# Patient Record
Sex: Female | Born: 1970
Health system: Southern US, Community
[De-identification: ages and names within clinical notes are randomized; demographics above are authoritative.]

## PROBLEM LIST (undated history)

## (undated) DIAGNOSIS — I10 Essential (primary) hypertension: Secondary | ICD-10-CM

## (undated) DIAGNOSIS — E119 Type 2 diabetes mellitus without complications: Secondary | ICD-10-CM

## (undated) DIAGNOSIS — E785 Hyperlipidemia, unspecified: Secondary | ICD-10-CM

## (undated) DIAGNOSIS — K439 Ventral hernia without obstruction or gangrene: Secondary | ICD-10-CM

## (undated) HISTORY — PX: BREAST SURGERY: SHX581

## (undated) HISTORY — PX: HERNIA REPAIR: SHX51

## (undated) HISTORY — PX: ABDOMINAL HYSTERECTOMY: SHX81

---

## 2006-06-20 LAB — CONVERTED CEMR LAB: Pap Smear: NORMAL

## 2007-01-11 ENCOUNTER — Ambulatory Visit: Payer: Self-pay | Admitting: Internal Medicine

## 2007-01-11 DIAGNOSIS — K219 Gastro-esophageal reflux disease without esophagitis: Secondary | ICD-10-CM

## 2007-01-11 DIAGNOSIS — E119 Type 2 diabetes mellitus without complications: Secondary | ICD-10-CM | POA: Insufficient documentation

## 2007-01-11 DIAGNOSIS — I10 Essential (primary) hypertension: Secondary | ICD-10-CM | POA: Insufficient documentation

## 2007-01-11 DIAGNOSIS — Z8719 Personal history of other diseases of the digestive system: Secondary | ICD-10-CM | POA: Insufficient documentation

## 2007-01-11 DIAGNOSIS — E785 Hyperlipidemia, unspecified: Secondary | ICD-10-CM | POA: Insufficient documentation

## 2007-01-18 LAB — CONVERTED CEMR LAB
BUN: 13 mg/dL (ref 6–23)
Calcium: 9.9 mg/dL (ref 8.4–10.5)
Creatinine,U: 127.7 mg/dL
GFR calc Af Amer: 144 mL/min
GFR calc non Af Amer: 119 mL/min
Glucose, Bld: 128 mg/dL — ABNORMAL HIGH (ref 70–99)
Microalb Creat Ratio: 14.9 mg/g (ref 0.0–30.0)

## 2007-04-11 ENCOUNTER — Encounter: Payer: Self-pay | Admitting: Internal Medicine

## 2007-05-25 ENCOUNTER — Ambulatory Visit: Payer: Self-pay | Admitting: Internal Medicine

## 2007-05-25 DIAGNOSIS — M25579 Pain in unspecified ankle and joints of unspecified foot: Secondary | ICD-10-CM

## 2007-05-25 LAB — CONVERTED CEMR LAB
Bilirubin Urine: NEGATIVE
Blood in Urine, dipstick: NEGATIVE
Glucose, Urine, Semiquant: NEGATIVE
Microalb Creat Ratio: 11 mg/g (ref 0.0–30.0)
Microalb, Ur: 1 mg/dL (ref 0.0–1.9)
Protein, U semiquant: NEGATIVE
Urobilinogen, UA: 0.2
WBC Urine, dipstick: NEGATIVE

## 2007-05-26 ENCOUNTER — Ambulatory Visit: Payer: Self-pay | Admitting: Internal Medicine

## 2007-06-01 LAB — CONVERTED CEMR LAB
Basophils Absolute: 0 10*3/uL (ref 0.0–0.1)
Direct LDL: 141.6 mg/dL
Eosinophils Absolute: 0.2 10*3/uL (ref 0.0–0.7)
HDL: 48.2 mg/dL (ref >39.0)
Hgb A1c MFr Bld: 6.7 % — ABNORMAL HIGH (ref 4.6–6.0)
MCHC: 33.3 g/dL (ref 30.0–36.0)
MCV: 87.6 fL (ref 78.0–100.0)
Neutrophils Relative %: 54.4 % (ref 43.0–77.0)
Platelets: 322 10*3/uL (ref 150–400)
Total CK: 165 units/L (ref 7–177)
Triglycerides: 151 mg/dL — ABNORMAL HIGH (ref 0–149)

## 2007-06-09 ENCOUNTER — Telehealth (INDEPENDENT_AMBULATORY_CARE_PROVIDER_SITE_OTHER): Payer: Self-pay | Admitting: *Deleted

## 2007-06-14 ENCOUNTER — Telehealth (INDEPENDENT_AMBULATORY_CARE_PROVIDER_SITE_OTHER): Payer: Self-pay | Admitting: *Deleted

## 2007-06-15 ENCOUNTER — Encounter: Payer: Self-pay | Admitting: Internal Medicine

## 2007-06-27 ENCOUNTER — Telehealth (INDEPENDENT_AMBULATORY_CARE_PROVIDER_SITE_OTHER): Payer: Self-pay | Admitting: *Deleted

## 2007-06-30 ENCOUNTER — Encounter: Payer: Self-pay | Admitting: Internal Medicine

## 2007-07-25 ENCOUNTER — Telehealth (INDEPENDENT_AMBULATORY_CARE_PROVIDER_SITE_OTHER): Payer: Self-pay | Admitting: *Deleted

## 2007-08-11 ENCOUNTER — Telehealth (INDEPENDENT_AMBULATORY_CARE_PROVIDER_SITE_OTHER): Payer: Self-pay | Admitting: *Deleted

## 2007-08-17 ENCOUNTER — Telehealth (INDEPENDENT_AMBULATORY_CARE_PROVIDER_SITE_OTHER): Payer: Self-pay | Admitting: *Deleted

## 2013-07-29 ENCOUNTER — Other Ambulatory Visit: Payer: Self-pay | Admitting: Endocrinology

## 2013-09-07 ENCOUNTER — Other Ambulatory Visit: Payer: Self-pay | Admitting: Endocrinology

## 2016-08-27 ENCOUNTER — Encounter (HOSPITAL_BASED_OUTPATIENT_CLINIC_OR_DEPARTMENT_OTHER): Payer: Self-pay | Admitting: Emergency Medicine

## 2016-08-27 ENCOUNTER — Emergency Department (HOSPITAL_BASED_OUTPATIENT_CLINIC_OR_DEPARTMENT_OTHER)
Admission: EM | Admit: 2016-08-27 | Discharge: 2016-08-27 | Disposition: A | Discharge status: Home / Self Care | Payer: BC Managed Care – PPO | Attending: Emergency Medicine | Admitting: Emergency Medicine

## 2016-08-27 DIAGNOSIS — I10 Essential (primary) hypertension: Secondary | ICD-10-CM | POA: Insufficient documentation

## 2016-08-27 DIAGNOSIS — Z79899 Other long term (current) drug therapy: Secondary | ICD-10-CM | POA: Insufficient documentation

## 2016-08-27 DIAGNOSIS — G8929 Other chronic pain: Secondary | ICD-10-CM

## 2016-08-27 DIAGNOSIS — Z7984 Long term (current) use of oral hypoglycemic drugs: Secondary | ICD-10-CM | POA: Insufficient documentation

## 2016-08-27 DIAGNOSIS — E119 Type 2 diabetes mellitus without complications: Secondary | ICD-10-CM | POA: Diagnosis not present

## 2016-08-27 DIAGNOSIS — R109 Unspecified abdominal pain: Secondary | ICD-10-CM | POA: Insufficient documentation

## 2016-08-27 DIAGNOSIS — R1013 Epigastric pain: Secondary | ICD-10-CM | POA: Diagnosis present

## 2016-08-27 DIAGNOSIS — Z794 Long term (current) use of insulin: Secondary | ICD-10-CM | POA: Insufficient documentation

## 2016-08-27 HISTORY — DX: Type 2 diabetes mellitus without complications: E11.9

## 2016-08-27 HISTORY — DX: Hyperlipidemia, unspecified: E78.5

## 2016-08-27 HISTORY — DX: Ventral hernia without obstruction or gangrene: K43.9

## 2016-08-27 HISTORY — DX: Essential (primary) hypertension: I10

## 2016-08-27 LAB — URINALYSIS, ROUTINE W REFLEX MICROSCOPIC
BILIRUBIN URINE: NEGATIVE
HGB URINE DIPSTICK: NEGATIVE
Ketones, ur: NEGATIVE mg/dL
Leukocytes, UA: NEGATIVE
Nitrite: NEGATIVE
PROTEIN: NEGATIVE mg/dL
SPECIFIC GRAVITY, URINE: 1.038 — AB (ref 1.005–1.030)
pH: 6.5 (ref 5.0–8.0)

## 2016-08-27 LAB — URINALYSIS, MICROSCOPIC (REFLEX)

## 2016-08-27 LAB — CBC WITH DIFFERENTIAL/PLATELET
BASOS ABS: 0 10*3/uL (ref 0.0–0.1)
Basophils Relative: 1 %
Eosinophils Absolute: 0.1 10*3/uL (ref 0.0–0.7)
Eosinophils Relative: 2 %
HEMATOCRIT: 39.4 % (ref 36.0–46.0)
HEMOGLOBIN: 13.1 g/dL (ref 12.0–15.0)
LYMPHS ABS: 2.4 10*3/uL (ref 0.7–4.0)
LYMPHS PCT: 38 %
MCH: 28.5 pg (ref 26.0–34.0)
MCHC: 33.2 g/dL (ref 30.0–36.0)
MCV: 85.7 fL (ref 78.0–100.0)
Monocytes Absolute: 0.6 10*3/uL (ref 0.1–1.0)
Monocytes Relative: 10 %
NEUTROS ABS: 3.1 10*3/uL (ref 1.7–7.7)
NEUTROS PCT: 49 %
PLATELETS: 334 10*3/uL (ref 150–400)
RBC: 4.6 MIL/uL (ref 3.87–5.11)
RDW: 15.1 % (ref 11.5–15.5)
WBC: 6.2 10*3/uL (ref 4.0–10.5)

## 2016-08-27 LAB — COMPREHENSIVE METABOLIC PANEL
ALK PHOS: 57 U/L (ref 38–126)
ALT: 24 U/L (ref 14–54)
AST: 21 U/L (ref 15–41)
Albumin: 4 g/dL (ref 3.5–5.0)
Anion gap: 9 (ref 5–15)
BILIRUBIN TOTAL: 0.4 mg/dL (ref 0.3–1.2)
BUN: 13 mg/dL (ref 6–20)
CALCIUM: 9.5 mg/dL (ref 8.9–10.3)
CHLORIDE: 105 mmol/L (ref 101–111)
CO2: 26 mmol/L (ref 22–32)
CREATININE: 0.69 mg/dL (ref 0.44–1.00)
GFR calc Af Amer: >60 mL/min (ref >60)
Glucose, Bld: 203 mg/dL — ABNORMAL HIGH (ref 65–99)
Potassium: 4.4 mmol/L (ref 3.5–5.1)
Sodium: 140 mmol/L (ref 135–145)
Total Protein: 7.6 g/dL (ref 6.5–8.1)

## 2016-08-27 LAB — LIPASE, BLOOD: LIPASE: 26 U/L (ref 11–51)

## 2016-08-27 MED ORDER — ONDANSETRON HCL 4 MG/2ML IJ SOLN
4.0000 mg | Freq: Once | INTRAMUSCULAR | Status: AC
  Administered 2016-08-27: 4 mg via INTRAVENOUS
  Filled 2016-08-27: qty 2

## 2016-08-27 MED ORDER — ONDANSETRON HCL 4 MG PO TABS: 4.0000 mg | ORAL_TABLET | Freq: Three times a day (TID) | ORAL | 0 refills | Status: AC | PRN

## 2016-08-27 MED ORDER — SODIUM CHLORIDE 0.9 % IV BOLUS (SEPSIS)
1000.0000 mL | Freq: Once | INTRAVENOUS | Status: AC
  Administered 2016-08-27: 1000 mL via INTRAVENOUS

## 2016-08-27 MED ORDER — SODIUM CHLORIDE 0.9 % IV SOLN: 1000.0000 mL | INTRAVENOUS | Status: DC

## 2016-08-27 MED ORDER — GI COCKTAIL ~~LOC~~
30.0000 mL | Freq: Once | ORAL | Status: AC
  Administered 2016-08-27: 30 mL via ORAL
  Filled 2016-08-27: qty 30

## 2016-08-27 MED ORDER — FAMOTIDINE 40 MG PO TABS: 40.0000 mg | ORAL_TABLET | Freq: Every day | ORAL | 0 refills | Status: AC

## 2016-08-27 MED FILL — ONDANSETRON HCL 4 MG TABLET: 4 | 6 days supply | Qty: 18 | Fill #0

## 2016-08-27 MED FILL — FAMOTIDINE 40 MG TABLET: 40 | 30 days supply | Qty: 30 | Fill #0

## 2016-08-27 NOTE — Discharge Instructions (Signed)
Continue to take your at-home medications. You may use Zofran as needed for nausea or vomiting. You may start Pepcid once daily. Follow-up with your gastroenterologist for further evaluation of your abdominal pain. Return to the emergency department if you develop fever, chills, vomiting, worsening pain, or any new or worsening symptoms.

## 2016-08-27 NOTE — ED Triage Notes (Signed)
Patient reports abdominal pain which began today. Reports continuous problems with abdomen after surgery in 2013 for inguinal hernia.  Patient reports she was seen yesterday and had a scan done of her aorta as well as ultrasound of kidneys due to constant pain that she has been having.  Patient sees GI doctor at present with [facility]. Reports N/V/D today.

## 2016-08-27 NOTE — ED Provider Notes (Signed)
[facility] Provider [facility]   CSN: [id number] Arrival date & time: 08/27/16  0930     History   Chief Complaint Chief Complaint  Patient presents with  . Abdominal Pain    HPI Connie Nelson is a 46 y.o. female presenting with abdominal pain.  Patient has a several year history of abdominal pain, is presenting today because her pain is persistent. She describes it as epigastric pressure that pushes up and causes nausea and burning. The pain is intermittent, and not associated with oral intake. His pain has been present since 2013 when she had an hernia repair surgery. She is followed by gastroenterology, and had workup for gallbladder disease yesterday. Nothing makes her pain better. She is an appointment follow-up with gastroenterology on the 20th of this month. She denies fever, chills, cough, chest pain, shortness of breath, vomiting, urinary symptoms, or abnormal bowel movements.  HPI  Past Medical History:  Diagnosis Date  . Diabetes mellitus without complication (HCC)   . Hernia of abdominal wall   . Hyperlipidemia   . Hypertension     Patient Active Problem List   Diagnosis Date Noted  . ANKLE PAIN, LEFT 05/25/2007  . DIABETES MELLITUS, TYPE II 01/11/2007  . HYPERLIPIDEMIA 01/11/2007  . HYPERTENSION 01/11/2007  . GERD 01/11/2007  . GASTRIC POLYP, HX OF 01/11/2007    Past Surgical History:  Procedure Laterality Date  . ABDOMINAL HYSTERECTOMY    . BREAST SURGERY    . HERNIA REPAIR      OB History    No data available       Home Medications    Prior to Admission medications   Medication Sig Start Date End Date Taking? Authorizing Provider  amLODipine (NORVASC) 5 MG tablet Take 5 mg by mouth daily.   Yes [provider]  carvedilol (COREG) 12.5 MG tablet Take 12.5 mg by mouth 2 (two) times daily with a meal.   Yes [provider]  Empagliflozin (JARDIANCE PO) Take by mouth.   Yes [provider]  estradiol (ESTRACE) 1  MG tablet Take 1 mg by mouth daily.   Yes [provider]  hydrochlorothiazide (MICROZIDE) 12.5 MG capsule Take 12.5 mg by mouth daily.   Yes [provider]  insulin aspart (NOVOLOG) 100 UNIT/ML injection Inject into the skin 3 (three) times daily before meals.   Yes [provider]  insulin detemir (LEVEMIR) 100 UNIT/ML injection Inject 50 Units into the skin daily.   Yes [provider]  pantoprazole (PROTONIX) 40 MG tablet Take 40 mg by mouth 2 (two) times daily.   Yes [provider]  famotidine (PEPCID) 40 MG tablet Take 1 tablet (40 mg total) by mouth daily. 08/27/16   Addley Ballinger, PA-C  ondansetron (ZOFRAN) 4 MG tablet Take 1 tablet (4 mg total) by mouth every 8 (eight) hours as needed for nausea or vomiting. 08/27/16   Elica Almas, PA-C    Family History History reviewed. No pertinent family history.  Social History Social History  Substance Use Topics  . Smoking status: Never Smoker  . Smokeless tobacco: Never Used  . Alcohol use No     Allergies   Lisinopril; Metformin and related; Statins; and Sulfonamide derivatives   Review of Systems Review of Systems  Constitutional: Negative for chills and fever.  HENT: Negative for sore throat.   Eyes: Negative for visual disturbance.  Respiratory: Negative for cough, chest tightness and shortness of breath.   Cardiovascular: Negative for chest pain.  Gastrointestinal: Positive for abdominal pain and nausea. Negative for blood in stool, constipation, diarrhea and vomiting.  Genitourinary: Negative for dysuria, frequency and hematuria.  Musculoskeletal: Negative for neck pain and neck stiffness.  Skin: Negative for rash.  Neurological: Negative for headaches.  Hematological: Does not bruise/bleed easily.  Psychiatric/Behavioral: Negative for confusion.     Physical Exam Updated Vital Signs BP 121/73 (BP Location: Right Arm)   Pulse 71   Temp 99 F (37.2 C) (Oral)    Resp 18   Ht 5\' 5"  (1.651 m)   Wt 76.2 kg (168 lb)   SpO2 100%   BMI 27.96 kg/m   Physical Exam  Constitutional: She is oriented to person, place, and time. She appears well-developed and well-nourished. No distress.  HENT:  Head: Normocephalic and atraumatic.  Mouth/Throat: Uvula is midline, oropharynx is clear and moist and mucous membranes are normal.  Eyes: Pupils are equal, round, and reactive to light. EOM are normal.  Neck: Normal range of motion.  Cardiovascular: Normal rate, regular rhythm and intact distal pulses.   Pulmonary/Chest: Effort normal and breath sounds normal. No respiratory distress. She has no wheezes.  Abdominal: Soft. Bowel sounds are normal. She exhibits no distension. There is tenderness in the epigastric area. There is no rigidity, no rebound, no guarding, no CVA tenderness, no tenderness at McBurney's point and negative Murphy's sign.  Patient with mild tenderness to palpation in the epigastric area the abdomen. No tenderness elsewhere in the abdomen. No rebound or guarding. Normoactive bowel sounds 4  Musculoskeletal: Normal range of motion.  Lymphadenopathy:    She has no cervical adenopathy.  Neurological: She is alert and oriented to person, place, and time.  Skin: Skin is warm and dry. She is not diaphoretic.  Psychiatric: She has a normal mood and affect.  Nursing note and vitals reviewed.    ED Treatments / Results  Labs (all labs ordered are listed, but only abnormal results are displayed) Labs Reviewed  URINALYSIS, ROUTINE W REFLEX MICROSCOPIC - Abnormal; Notable for the following:       Result Value   Specific Gravity, Urine 1.038 (*)    Glucose, UA >=500 (*)    All other components within normal limits  URINALYSIS, MICROSCOPIC (REFLEX) - Abnormal; Notable for the following:    Bacteria, UA RARE (*)    Squamous Epithelial / LPF 6-30 (*)    All other components within normal limits  COMPREHENSIVE METABOLIC PANEL - Abnormal; Notable for  the following:    Glucose, Bld 203 (*)    All other components within normal limits  CBC WITH DIFFERENTIAL/PLATELET  LIPASE, BLOOD    EKG  EKG Interpretation None       Radiology No results found.  Procedures Procedures (including critical care time)  Medications Ordered in ED Medications  sodium chloride 0.9 % bolus 1,000 mL (0 mLs Intravenous Stopped 08/27/16 1211)  ondansetron (ZOFRAN) injection 4 mg (4 mg Intravenous Given 08/27/16 1102)  gi cocktail (Maalox,Lidocaine,Donnatal) (30 mLs Oral Given 08/27/16 1056)     Initial Impression / Assessment and Plan / ED Course  I have reviewed the triage vital signs and the nursing notes.  Pertinent labs & imaging results that were available during my care of the patient were reviewed by me and considered in my medical decision making (see chart for details).     Patient presenting with 5 years of abdominal pain, and concern as it is still persisting today. She is a patient of [facility],  and had a gallbladder study done yesterday. Per chart review, study showed no signs of gallstones. Cannot find recent lab work. Doubt infectious etiology, but the patient requests for further workup. Will order CBC, CMP, and UA to rule out infection, anemia, electrolyte abnormality. Will start IV fluids, give Zofran for nausea, and GI cocktail for the burning sensation.  On reassessment, patient states the Zofran helped her nausea, but she is still having the epigastric pain. Labs reassuring. Glucose in the urine is likely due to diabetes medicine. At this time, doubt intra-abdominal infection or obstruction. Discussed findings with patient. Patient requests for something further to help with heartburn like symptoms. Will give prescription for Zofran and Pepcid, and discussed importance of follow-up with GI. Return precautions given. Patient states she understands and agrees to plan.  Final Clinical Impressions(s) / ED Diagnoses   Final diagnoses:    Chronic abdominal pain    New Prescriptions Discharge Medication List as of 08/27/2016 12:07 PM    START taking these medications   Details  famotidine (PEPCID) 40 MG tablet Take 1 tablet (40 mg total) by mouth daily., Starting Thu 08/27/2016, Print    ondansetron (ZOFRAN) 4 MG tablet Take 1 tablet (4 mg total) by mouth every 8 (eight) hours as needed for nausea or vomiting., Starting Thu 08/27/2016, Print         Whitehouse, Charlaine Utsey, PA-C 08/27/16 1818    Pricilla Loveless, MD 08/28/16 1309

## 2017-07-16 ENCOUNTER — Encounter (HOSPITAL_BASED_OUTPATIENT_CLINIC_OR_DEPARTMENT_OTHER): Payer: Self-pay | Admitting: *Deleted

## 2017-07-16 ENCOUNTER — Emergency Department (HOSPITAL_BASED_OUTPATIENT_CLINIC_OR_DEPARTMENT_OTHER): Payer: BC Managed Care – PPO

## 2017-07-16 ENCOUNTER — Other Ambulatory Visit: Payer: Self-pay

## 2017-07-16 ENCOUNTER — Emergency Department (HOSPITAL_BASED_OUTPATIENT_CLINIC_OR_DEPARTMENT_OTHER)
Admission: EM | Admit: 2017-07-16 | Discharge: 2017-07-16 | Disposition: A | Discharge status: Home / Self Care | Payer: BC Managed Care – PPO | Attending: Emergency Medicine | Admitting: Emergency Medicine

## 2017-07-16 DIAGNOSIS — I10 Essential (primary) hypertension: Secondary | ICD-10-CM | POA: Insufficient documentation

## 2017-07-16 DIAGNOSIS — R509 Fever, unspecified: Secondary | ICD-10-CM | POA: Insufficient documentation

## 2017-07-16 DIAGNOSIS — R109 Unspecified abdominal pain: Secondary | ICD-10-CM | POA: Diagnosis not present

## 2017-07-16 DIAGNOSIS — R110 Nausea: Secondary | ICD-10-CM | POA: Insufficient documentation

## 2017-07-16 DIAGNOSIS — R350 Frequency of micturition: Secondary | ICD-10-CM | POA: Insufficient documentation

## 2017-07-16 DIAGNOSIS — E119 Type 2 diabetes mellitus without complications: Secondary | ICD-10-CM | POA: Diagnosis not present

## 2017-07-16 DIAGNOSIS — M545 Low back pain, unspecified: Secondary | ICD-10-CM

## 2017-07-16 DIAGNOSIS — Z794 Long term (current) use of insulin: Secondary | ICD-10-CM | POA: Diagnosis not present

## 2017-07-16 DIAGNOSIS — Z79899 Other long term (current) drug therapy: Secondary | ICD-10-CM | POA: Diagnosis not present

## 2017-07-16 LAB — COMPREHENSIVE METABOLIC PANEL
ALT: 16 U/L (ref 0–44)
ANION GAP: 12 (ref 5–15)
AST: 20 U/L (ref 15–41)
Albumin: 4 g/dL (ref 3.5–5.0)
Alkaline Phosphatase: 75 U/L (ref 38–126)
BILIRUBIN TOTAL: 0.4 mg/dL (ref 0.3–1.2)
BUN: 13 mg/dL (ref 6–20)
CO2: 27 mmol/L (ref 22–32)
Calcium: 9.4 mg/dL (ref 8.9–10.3)
Chloride: 100 mmol/L (ref 98–111)
Creatinine, Ser: 0.72 mg/dL (ref 0.44–1.00)
GFR calc Af Amer: >60 mL/min (ref >60)
GFR calc non Af Amer: >60 mL/min (ref >60)
Glucose, Bld: 181 mg/dL — ABNORMAL HIGH (ref 70–99)
POTASSIUM: 3.2 mmol/L — AB (ref 3.5–5.1)
Sodium: 139 mmol/L (ref 135–145)
TOTAL PROTEIN: 8.1 g/dL (ref 6.5–8.1)

## 2017-07-16 LAB — URINALYSIS, ROUTINE W REFLEX MICROSCOPIC
BILIRUBIN URINE: NEGATIVE
Glucose, UA: >=500 mg/dL — AB
Hgb urine dipstick: NEGATIVE
Ketones, ur: NEGATIVE mg/dL
LEUKOCYTES UA: NEGATIVE
NITRITE: NEGATIVE
PH: 6 (ref 5.0–8.0)
Protein, ur: NEGATIVE mg/dL

## 2017-07-16 LAB — CBC WITH DIFFERENTIAL/PLATELET
Basophils Absolute: 0 10*3/uL (ref 0.0–0.1)
Basophils Relative: 0 %
EOS ABS: 0.2 10*3/uL (ref 0.0–0.7)
Eosinophils Relative: 2 %
HEMATOCRIT: 42.2 % (ref 36.0–46.0)
HEMOGLOBIN: 14.2 g/dL (ref 12.0–15.0)
LYMPHS ABS: 2.7 10*3/uL (ref 0.7–4.0)
LYMPHS PCT: 36 %
MCH: 28.9 pg (ref 26.0–34.0)
MCHC: 33.6 g/dL (ref 30.0–36.0)
MCV: 85.9 fL (ref 78.0–100.0)
MONOS PCT: 8 %
Monocytes Absolute: 0.6 10*3/uL (ref 0.1–1.0)
NEUTROS ABS: 4 10*3/uL (ref 1.7–7.7)
NEUTROS PCT: 54 %
Platelets: 368 10*3/uL (ref 150–400)
RBC: 4.91 MIL/uL (ref 3.87–5.11)
RDW: 15.6 % — AB (ref 11.5–15.5)
WBC: 7.5 10*3/uL (ref 4.0–10.5)

## 2017-07-16 LAB — URINALYSIS, MICROSCOPIC (REFLEX): RBC / HPF: NONE SEEN RBC/hpf (ref 0–5)

## 2017-07-16 MED ORDER — LIDOCAINE 5 % EX PTCH: 1.0000 | MEDICATED_PATCH | CUTANEOUS | 0 refills | Status: AC

## 2017-07-16 MED ORDER — ONDANSETRON HCL 4 MG/2ML IJ SOLN
4.0000 mg | Freq: Once | INTRAMUSCULAR | Status: AC
  Administered 2017-07-16: 4 mg via INTRAVENOUS
  Filled 2017-07-16: qty 2

## 2017-07-16 MED ORDER — SODIUM CHLORIDE 0.9 % IV SOLN
INTRAVENOUS | Status: DC
  Administered 2017-07-16 (×2): via INTRAVENOUS
  Administered 2017-07-16: 1000 mL via INTRAVENOUS

## 2017-07-16 MED ORDER — MORPHINE SULFATE (PF) 4 MG/ML IV SOLN
4.0000 mg | Freq: Once | INTRAVENOUS | Status: AC
  Administered 2017-07-16: 4 mg via INTRAVENOUS
  Filled 2017-07-16: qty 1

## 2017-07-16 MED ORDER — HYDROCODONE-ACETAMINOPHEN 5-325 MG PO TABS: 1.0000 | ORAL_TABLET | Freq: Four times a day (QID) | ORAL | 0 refills | Status: AC | PRN

## 2017-07-16 MED FILL — HYDROCODON-APAP 5-325: 5-325 | 2 days supply | Qty: 10 | Fill #0

## 2017-07-16 NOTE — ED Triage Notes (Signed)
Back pain. She was seen by her MD 2 days ago and had a negative urine evaluation. She was given Flexeril. He thought she had sciatica. Pain is no better.

## 2017-07-16 NOTE — Discharge Instructions (Signed)
Heating pad or hot shower to the area.  Avoid heavy lifting or twisting.  Make sure you are getting out of bed and are as active as possible.  Continue taking the current medications you are using.

## 2017-07-16 NOTE — ED Provider Notes (Signed)
[facility] EMERGENCY DEPARTMENT Provider Note   CSN: [id number] Arrival date & time: 07/16/17  1424     History   Chief Complaint Chief Complaint  Patient presents with  . Back Pain    HPI Connie Nelson is a 47 y.o. female.  The history is provided by the patient.  Back Pain   This is a new problem. Episode onset: started on monday. The problem occurs constantly. The problem has not changed since onset.The pain is associated with no known injury. Pain location: left flank and lumbar region. The quality of the pain is described as stabbing, shooting and aching. The pain does not radiate. The pain is at a severity of 8/10. The pain is severe. Exacerbated by: seems to be worse with standing and sitting.  some relief with lying down. The pain is the same all the time. Associated symptoms include a fever. Pertinent negatives include no chest pain, no headaches, no abdominal pain, no abdominal swelling, no bladder incontinence, no dysuria, no leg pain, no tingling and no weakness. Associated symptoms comments: Temp of 100 on Monday and tues but then resolved.  Urinary frequency but no dysuria or hesitancy.  Constant nausea but no vomiting or diarrhea.  No SOB or cough.  Pain is not worsened by eating or urinating.  Only relief is with lying down and walking seems to make it worse.. She has tried NSAIDs and muscle relaxants for the symptoms. The treatment provided no relief. Risk factors: hx of DM, HTN, HLD.    Past Medical History:  Diagnosis Date  . Diabetes mellitus without complication ([place])   . Hernia of abdominal wall   . Hyperlipidemia   . Hypertension     Patient Active Problem List   Diagnosis Date Noted  . ANKLE PAIN, LEFT 05/25/2007  . DIABETES MELLITUS, TYPE II 01/11/2007  . HYPERLIPIDEMIA 01/11/2007  . HYPERTENSION 01/11/2007  . GERD 01/11/2007  . GASTRIC POLYP, HX OF 01/11/2007    Past Surgical History:  Procedure Laterality Date  . ABDOMINAL HYSTERECTOMY     . BREAST SURGERY    . HERNIA REPAIR       OB History   None      Home Medications    Prior to Admission medications   Medication Sig Start Date End Date Taking? Authorizing Provider  amLODipine (NORVASC) 5 MG tablet Take 5 mg by mouth daily.   Yes [provider]  carvedilol (COREG) 12.5 MG tablet Take 12.5 mg by mouth 2 (two) times daily with a meal.   Yes [provider]  dexlansoprazole (DEXILANT) 60 MG capsule Take 60 mg by mouth daily.   Yes [provider]  Empagliflozin (JARDIANCE PO) Take by mouth.   Yes [provider]  estradiol (ESTRACE) 1 MG tablet Take 1 mg by mouth daily.   Yes [provider]  famotidine (PEPCID) 40 MG tablet Take 1 tablet (40 mg total) by mouth daily. 08/27/16  Yes Caccavale, Sophia, PA-C  folic acid (FOLVITE) 1 MG tablet Take 1 mg by mouth daily.   Yes [provider]  hydrochlorothiazide (MICROZIDE) 12.5 MG capsule Take 12.5 mg by mouth daily.   Yes [provider]  insulin aspart (NOVOLOG) 100 UNIT/ML injection Inject into the skin 3 (three) times daily before meals.   Yes [provider]  Insulin Disposable Pump (V-GO 40) KIT by Does not apply route.   Yes [provider]  methotrexate 2.5 MG tablet Take by mouth 3 (three) times  a week.   Yes [provider]  ondansetron (ZOFRAN) 4 MG tablet Take 1 tablet (4 mg total) by mouth every 8 (eight) hours as needed for nausea or vomiting. 08/27/16  Yes Caccavale, Sophia, PA-C  PREDNISOLONE ACETATE OP Apply to eye.   Yes [provider]  insulin detemir (LEVEMIR) 100 UNIT/ML injection Inject 50 Units into the skin daily.    [provider]  pantoprazole (PROTONIX) 40 MG tablet Take 40 mg by mouth 2 (two) times daily.    [provider]    Family History No family history on file.  Social History Social History   Tobacco Use  . Smoking status: Never Smoker  . Smokeless tobacco: Never  Used  Substance Use Topics  . Alcohol use: No  . Drug use: No     Allergies   Lisinopril; Metformin and related; Statins; and Sulfonamide derivatives   Review of Systems Review of Systems  Constitutional: Positive for fever.  Cardiovascular: Negative for chest pain.  Gastrointestinal: Negative for abdominal pain.  Genitourinary: Negative for bladder incontinence and dysuria.  Musculoskeletal: Positive for back pain.  Neurological: Negative for tingling, weakness and headaches.  All other systems reviewed and are negative.    Physical Exam Updated Vital Signs BP 137/88 (BP Location: Left Arm)   Pulse 80   Temp 98.2 F (36.8 C) (Oral)   Resp 20   Ht '5\' 5"'  (1.651 m)   Wt 78.5 kg (173 lb)   SpO2 100%   BMI 28.79 kg/m   Physical Exam  Constitutional: She is oriented to person, place, and time. She appears well-developed and well-nourished. No distress.  HENT:  Head: Normocephalic and atraumatic.  Mouth/Throat: Oropharynx is clear and moist.  Eyes: Pupils are equal, round, and reactive to light. Conjunctivae and EOM are normal.  Neck: Normal range of motion. Neck supple.  Cardiovascular: Normal rate, regular rhythm and intact distal pulses.  No murmur heard. Pulmonary/Chest: Effort normal and breath sounds normal. No respiratory distress. She has no wheezes. She has no rales.  Abdominal: Soft. She exhibits no distension. There is tenderness in the left lower quadrant. There is CVA tenderness. There is no rebound and no guarding.  Left flank tenderness  Musculoskeletal: Normal range of motion. She exhibits no edema or tenderness.       Back:  Neurological: She is alert and oriented to person, place, and time. She has normal strength. No sensory deficit. Gait normal.  Skin: Skin is warm and dry. No rash noted. No erythema.  Psychiatric: She has a normal mood and affect. Her behavior is normal.  Nursing note and vitals reviewed.    ED Treatments / Results  Labs (all  labs ordered are listed, but only abnormal results are displayed) Labs Reviewed  URINALYSIS, ROUTINE W REFLEX MICROSCOPIC - Abnormal; Notable for the following components:      Result Value   Color, Urine STRAW (*)    Specific Gravity, Urine <1.005 (*)    Glucose, UA >=500 (*)    All other components within normal limits  CBC WITH DIFFERENTIAL/PLATELET - Abnormal; Notable for the following components:   RDW 15.6 (*)    All other components within normal limits  COMPREHENSIVE METABOLIC PANEL - Abnormal; Notable for the following components:   Potassium 3.2 (*)    Glucose, Bld 181 (*)    All other components within normal limits  URINALYSIS, MICROSCOPIC (REFLEX) - Abnormal; Notable for the following components:   Bacteria, UA MANY (*)  All other components within normal limits    EKG None  Radiology Ct Renal Stone Study  Result Date: 07/16/2017 CLINICAL DATA:  Initial evaluation for acute left flank pain. EXAM: CT ABDOMEN AND PELVIS WITHOUT CONTRAST TECHNIQUE: Multidetector CT imaging of the abdomen and pelvis was performed following the standard protocol without IV contrast. COMPARISON:  Prior CT from 01/11/2017. FINDINGS: Lower chest: Visualized lung bases are clear. Hepatobiliary: Liver demonstrates a normal unenhanced appearance. Gallbladder within normal limits. No biliary dilatation. Pancreas: Pancreas within normal limits. Spleen: Spleen within normal limits. Adrenals/Urinary Tract: Adrenal glands within normal limits. Kidneys equal in size. 2.3 cm cyst present within the interpolar left kidney. 4 mm nonobstructive calculi present within the lower pole the right kidney and upper pole the left kidney. No radiopaque calculi seen along the course of either renal collecting system. No hydronephrosis or hydroureter. No layering stones seen within the bladder lumen. Partially distended bladder otherwise unremarkable. Stomach/Bowel: Stomach within normal limits. No evidence for bowel  obstruction. Normal appendix. Colonic diverticulosis without evidence for acute diverticulitis. No acute inflammatory changes seen about the bowels. Vascular/Lymphatic: Intra-abdominal aorta of normal caliber. No adenopathy. Reproductive: Uterus is absent.  Ovaries not discretely identified. Other: No free air or fluid.  Prior ventral hernia repair noted. Musculoskeletal: No acute osseous abnormality. No worrisome lytic or blastic osseous lesions. Moderate degenerative spondylolysis and facet arthrosis at L5-S1. IMPRESSION: 1. Bilateral nonobstructive nephrolithiasis as above. No CT evidence for obstructive uropathy. 2. No other acute intra-abdominal or pelvic process. 3. Colonic diverticulosis without evidence for acute diverticulitis. Electronically Signed   By: Jeannine Boga M.D.   On: 07/16/2017 16:04    Procedures Procedures (including critical care time)  Medications Ordered in ED Medications  morphine 4 MG/ML injection 4 mg (has no administration in time range)  ondansetron (ZOFRAN) injection 4 mg (has no administration in time range)  0.9 %  sodium chloride infusion (has no administration in time range)     Initial Impression / Assessment and Plan / ED Course  I have reviewed the triage vital signs and the nursing notes.  Pertinent labs & imaging results that were available during my care of the patient were reviewed by me and considered in my medical decision making (see chart for details).     Pt with hx of kidney stones, DM and HTN/HLD presenting with back pain since Monday.  Pain started suddenly without inciting incident.  Pt has low grade temp of 100 and due to pain not improving she saw PCP on Wednesday and had neg urine and told most likely MSK pain.  Pt denies any radiation of pain or bowel incontinence or bladder retention.  She does have urinary frequency but that seems to be chronic.  She is taking Ibuprofen and flexeril but no improvement in pain.  She is nauseated  but not vomiting and here due to pain not improving.  No pelvic or vaginal sx.  On exam pt has left flank pain and mild LLQ pain.  Concern for possible kidney stone as the cause.  Lower suspicion for bowel pathology and s/p hysterectomy.  Sx not consistent with torsion.  No chest pain or SOB and lower concern for cardiac or lung pathology.  Will give IV pain and nausea control.  CBC, CMP and UA pending.  CT renal pending.  4:41 PM No evidence of acute findings on CT and non-obstructing stones present.  Labs without acute findings.  Will d/c home to continue ibuprofen, flexeril and will add  vicodin and lidocaine patch  Final Clinical Impressions(s) / ED Diagnoses   Final diagnoses:  Acute left-sided low back pain without sciatica    ED Discharge Orders        Ordered    lidocaine (LIDODERM) 5 %  Every 24 hours     07/16/17 1642    HYDROcodone-acetaminophen (NORCO/VICODIN) 5-325 MG tablet  Every 6 hours PRN     07/16/17 1642       Blanchie Dessert, MD 07/16/17 1643

## 2019-01-30 IMAGING — CT CT RENAL STONE PROTOCOL
2 of 4 series · 16 of 46 positions shown, 18 images · non-contrast
Comparison: Prior CT from 01/11/2017.

CLINICAL DATA: Initial evaluation for acute left flank pain.

EXAM:
CT ABDOMEN AND PELVIS WITHOUT CONTRAST
TECHNIQUE: Multidetector CT imaging of the abdomen and pelvis was performed
following the standard protocol without IV contrast.

[Series 2: axial st · axial · 0.79mm/px · z∈[-417,-12]mm · 13 of 89 slices shown, 15 images]
[im 4/89  soft-tissue]
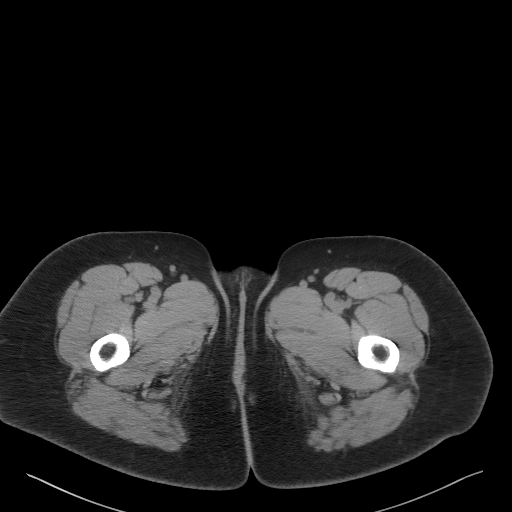
[im 4/89  bone]
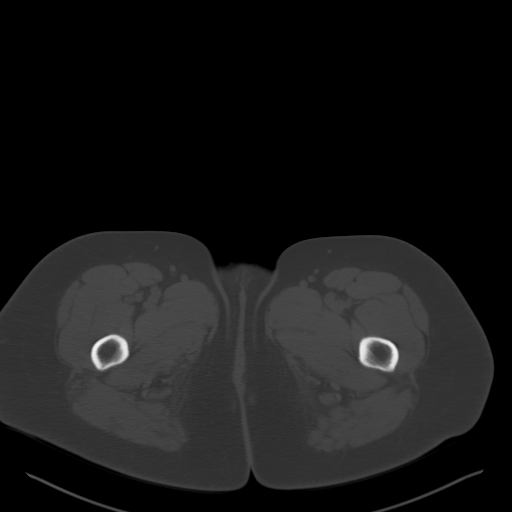
[im 11/89  soft-tissue]
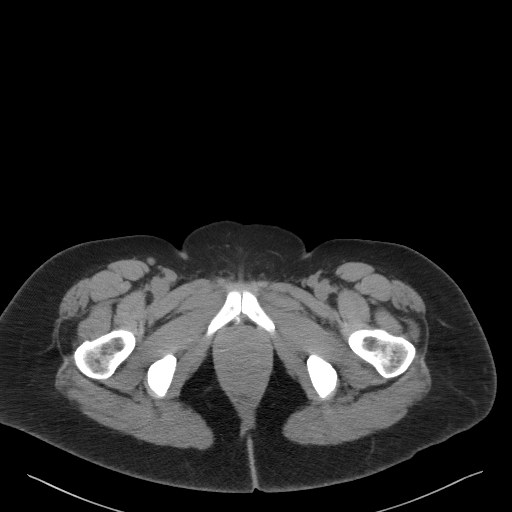
[im 17/89  soft-tissue]
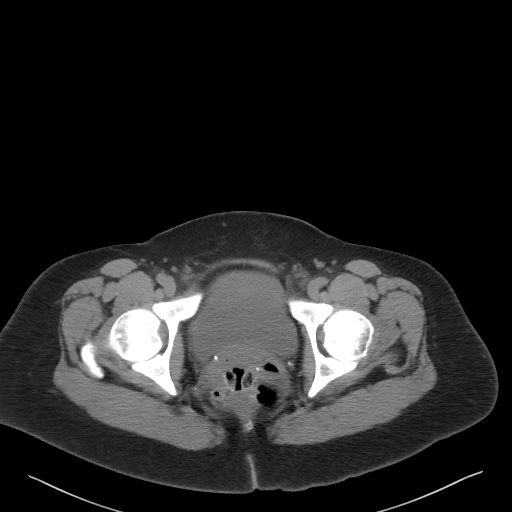
[im 24/89  soft-tissue]
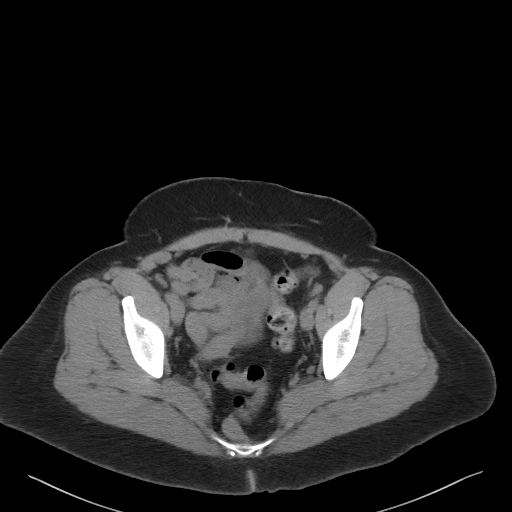
[im 31/89  soft-tissue]
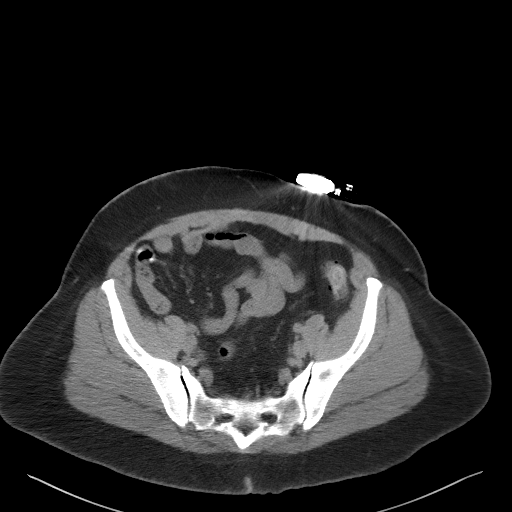
[im 38/89  soft-tissue]
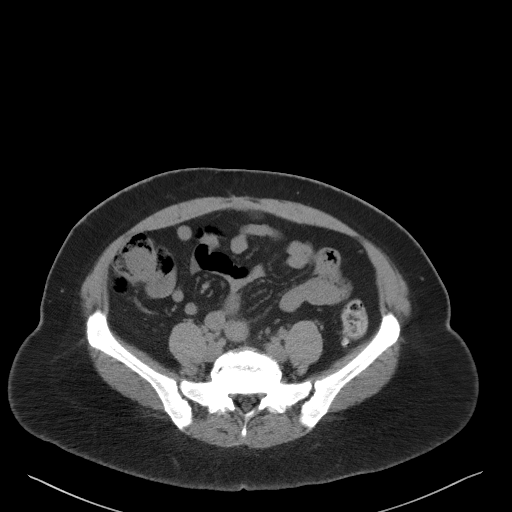
[im 45/89  soft-tissue]
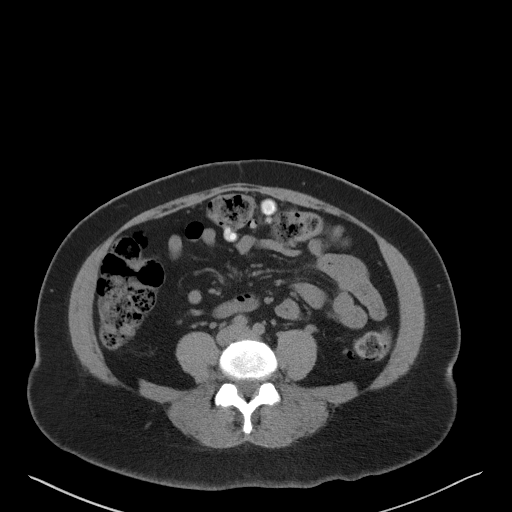
[im 51/89  soft-tissue]
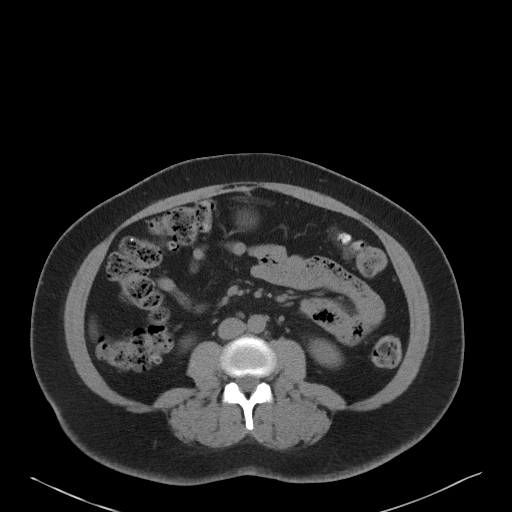
[im 58/89  soft-tissue]
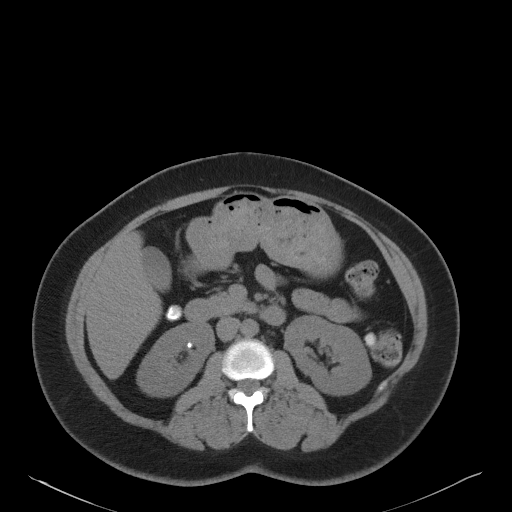
[im 58/89  bone]
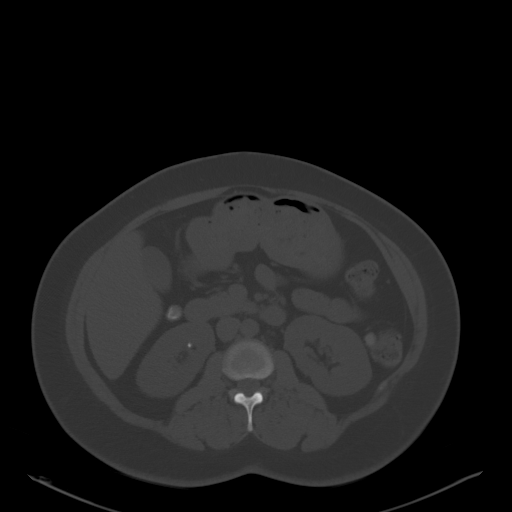
[im 65/89  soft-tissue]
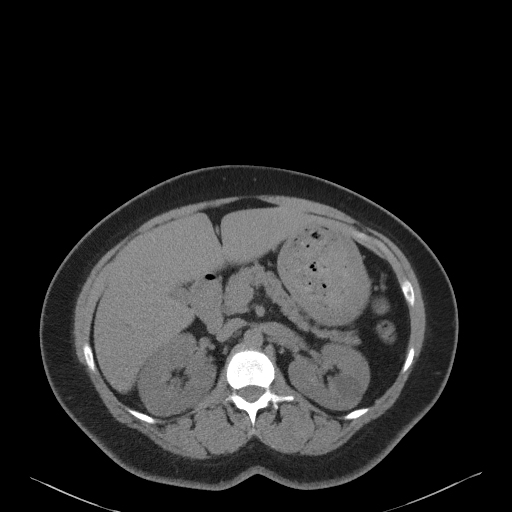
[im 72/89  soft-tissue]
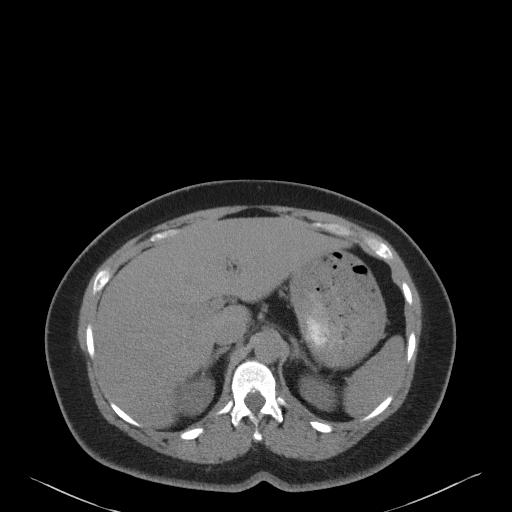
[im 78/89  soft-tissue]
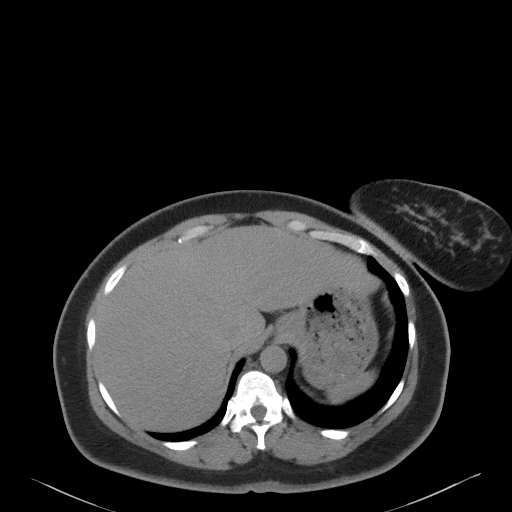
[im 85/89  soft-tissue]
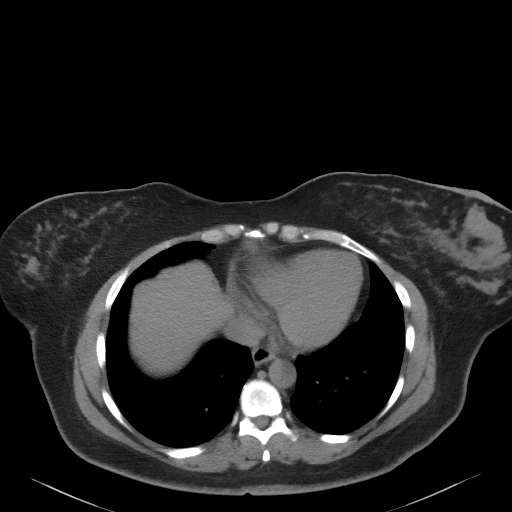

[Series 5: coronal st · coronal · 0.72mm/px · 3 of 88 slices shown]
[im 30/88  soft-tissue]
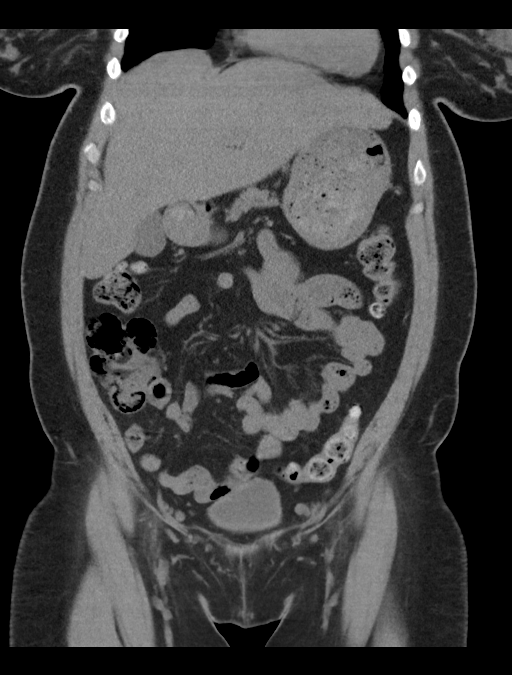
[im 39/88  soft-tissue]
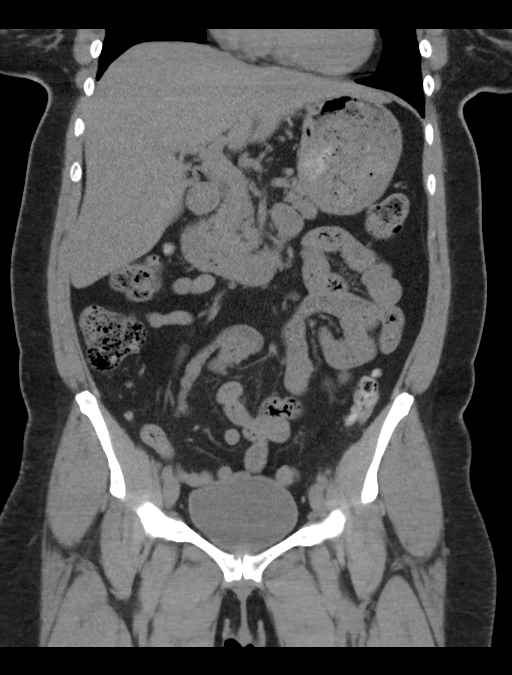
[im 49/88  soft-tissue]
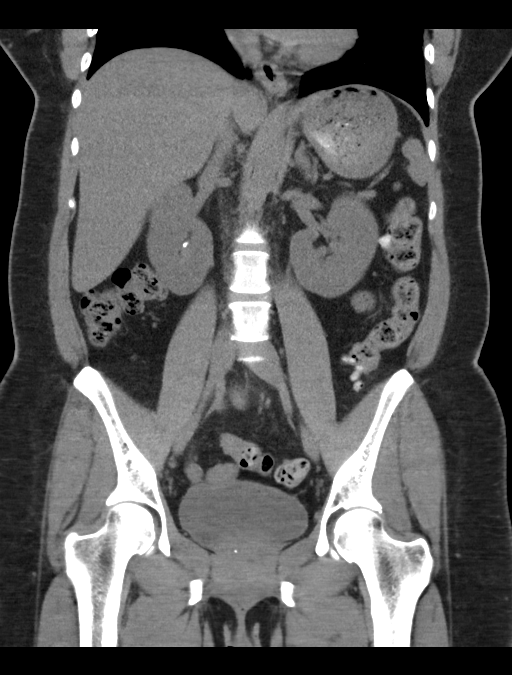

[16 of 46 positions shown; findings below may reference images not displayed]

FINDINGS: Lower chest: Visualized lung bases are clear.

Hepatobiliary: Liver demonstrates a normal unenhanced appearance.
Gallbladder within normal limits. No biliary dilatation.

Pancreas: Pancreas within normal limits.

Spleen: Spleen within normal limits.

Adrenals/Urinary Tract: Adrenal glands within normal limits.

Kidneys equal in size. 2.3 cm cyst present within the interpolar
left kidney. 4 mm nonobstructive calculi present within the lower
pole the right kidney and upper pole the left kidney. No radiopaque
calculi seen along the course of either renal collecting system. No
hydronephrosis or hydroureter. No layering stones seen within the
bladder lumen. Partially distended bladder otherwise unremarkable.

Stomach/Bowel: Stomach within normal limits. No evidence for bowel
obstruction. Normal appendix. Colonic diverticulosis without
evidence for acute diverticulitis. No acute inflammatory changes
seen about the bowels.

Vascular/Lymphatic: Intra-abdominal aorta of normal caliber. No
adenopathy.

Reproductive: Uterus is absent.  Ovaries not discretely identified.

Other: No free air or fluid.  Prior ventral hernia repair noted.

Musculoskeletal: No acute osseous abnormality. No worrisome lytic or
blastic osseous lesions. Moderate degenerative spondylolysis and
facet arthrosis at L5-S1.
IMPRESSION: 1. Bilateral nonobstructive nephrolithiasis as above. No CT evidence
for obstructive uropathy.
2. No other acute intra-abdominal or pelvic process.
3. Colonic diverticulosis without evidence for acute diverticulitis.
